# Patient Record
Sex: Female | Born: 1995 | Race: Black or African American | Hispanic: No | Marital: Single | Smoking: Never smoker
Health system: Southern US, Community
[De-identification: ages and names within clinical notes are randomized; demographics above are authoritative.]

## PROBLEM LIST (undated history)

## (undated) ENCOUNTER — Inpatient Hospital Stay (HOSPITAL_COMMUNITY): Payer: Self-pay

## (undated) DIAGNOSIS — Z789 Other specified health status: Secondary | ICD-10-CM

## (undated) HISTORY — PX: TONSILLECTOMY AND ADENOIDECTOMY: SHX28

---
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
## 2015-02-25 ENCOUNTER — Ambulatory Visit (INDEPENDENT_AMBULATORY_CARE_PROVIDER_SITE_OTHER): Payer: BC Managed Care – PPO | Admitting: Obstetrics & Gynecology

## 2015-02-25 ENCOUNTER — Encounter: Payer: Self-pay | Admitting: Obstetrics & Gynecology

## 2015-02-25 VITALS — BP 128/79 | HR 75 | Temp 98.4°F | Ht 62.0 in | Wt 234.8 lb

## 2015-02-25 DIAGNOSIS — Z30013 Encounter for initial prescription of injectable contraceptive: Secondary | ICD-10-CM

## 2015-02-25 DIAGNOSIS — Z3042 Encounter for surveillance of injectable contraceptive: Secondary | ICD-10-CM

## 2015-02-25 LAB — POCT PREGNANCY, URINE: PREG TEST UR: NEGATIVE

## 2015-02-25 MED ORDER — MEDROXYPROGESTERONE ACETATE 150 MG/ML IM SUSP
150.0000 mg | Freq: Once | INTRAMUSCULAR | Status: AC
  Administered 2015-02-25: 150 mg via INTRAMUSCULAR

## 2015-02-25 NOTE — Progress Notes (Signed)
Subjective:     Patient ID: Alisha BaltimoreMiracle Beasley, female   DOB: 01/19/1996, 19 y.o.   MRN: [id number]  HPIcc: wants to start depo provera [id number] Patient's last menstrual period was 02/20/2015 (exact date). Uses condoms, wishes to have depo provera. Currently menstruating Past Medical History  Diagnosis Date  . Medical history non-contributory    Past Surgical History  Procedure Laterality Date  . Tonsillectomy and adenoidectomy     No Known Allergies Current Outpatient Prescriptions on File Prior to Visit  Medication Sig Dispense Refill  . ibuprofen (ADVIL,MOTRIN) 600 MG tablet Take 1 tablet (600 mg total) by mouth every 6 (six) hours. 30 tablet 0  . ferrous sulfate 325 (65 FE) MG tablet Take 1 tablet (325 mg total) by mouth 3 (three) times daily with meals. (Patient not taking: Reported on 02/25/2015) 60 tablet 3   No current facility-administered medications on file prior to visit.     Review of Systems  Constitutional: Negative.   Gastrointestinal: Negative.   Genitourinary: Positive for vaginal bleeding. Negative for vaginal discharge, menstrual problem and pelvic pain.       Objective:   Physical Exam  Constitutional: She is oriented to person, place, and time. She appears well-developed. No distress.  Pulmonary/Chest: Effort normal. No respiratory distress.  Genitourinary:  deferred  Neurological: She is alert and oriented to person, place, and time.  Skin: Skin is warm and dry.  Psychiatric: She has a normal mood and affect. Her behavior is normal.       Assessment:     Candidate for DMPA as BCM, method and risks discussed     Plan:     DMPA 150 mg IM Q2342m, instructions given, questions answered  10 min face to face and coordination of care  Adam [place] G Miasha Emmons, MD 02/25/2015

## 2015-02-25 NOTE — Patient Instructions (Signed)

## 2015-03-26 ENCOUNTER — Emergency Department (HOSPITAL_COMMUNITY)
Admission: EM | Admit: 2015-03-26 | Discharge: 2015-03-26 | Disposition: A | Discharge status: Home / Self Care | Payer: BC Managed Care – PPO | Attending: Emergency Medicine | Admitting: Emergency Medicine

## 2015-03-26 ENCOUNTER — Encounter (HOSPITAL_COMMUNITY): Payer: Self-pay | Admitting: Emergency Medicine

## 2015-03-26 DIAGNOSIS — Z87828 Personal history of other (healed) physical injury and trauma: Secondary | ICD-10-CM | POA: Insufficient documentation

## 2015-03-26 DIAGNOSIS — M25561 Pain in right knee: Secondary | ICD-10-CM | POA: Insufficient documentation

## 2015-03-26 DIAGNOSIS — M25562 Pain in left knee: Secondary | ICD-10-CM | POA: Insufficient documentation

## 2015-03-26 MED ORDER — IBUPROFEN 600 MG PO TABS: 600.0000 mg | ORAL_TABLET | Freq: Four times a day (QID) | ORAL | Status: AC | PRN

## 2015-03-26 NOTE — ED Notes (Signed)
Pt reports recurrent r/knee pain when she exercises

## 2015-03-26 NOTE — Discharge Instructions (Signed)

## 2015-03-26 NOTE — ED Provider Notes (Signed)
CSN: [id number]     Arrival date & time 03/26/15  1836 History  This chart was scribed for Fayrene HelperBowie Jarriel Papillion, PA-C, working with Arby BarretteMarcy Pfeiffer, MD by Chestine SporeSoijett Blue, ED Scribe. The patient was seen in room WTR7/WTR7 at 7:06 PM.     Chief Complaint  Patient presents with  . Knee Pain    increased r/knee pain      The history is provided by the patient. No language interpreter was used.    HPI Comments: [name] is a 19 y.o. female who presents to the Emergency Department complaining of right knee pain onset 2 years. Pt notes that she has only had pain in her knee for the past couple years. pt notes that she had an injury to her right knee 2 years ago where she attempted to do a front flip and landed on the cement. Pt is in numerous activities at school that are exacerbating her pain. Pt notes that she has pain when she tries to stand. Pt reports that the pain occurs when she exercises only. She denies joint swelling, color change, wound, and any other symptoms.She is planning on trying out for dance and wanted to have her knees check.  No hip or ankle pain.     Past Medical History  Diagnosis Date  . Medical history non-contributory    Past Surgical History  Procedure Laterality Date  . Tonsillectomy and adenoidectomy     Family History  Problem Relation Age of Onset  . Hypertension Mother   . Diabetes Mother    History  Substance Use Topics  . Smoking status: Never Smoker   . Smokeless tobacco: Not on file  . Alcohol Use: No   OB History    Gravida Para Term Preterm AB TAB SAB Ectopic Multiple Living   2 1 1  0 1 1 0 0 0 1     Review of Systems  Musculoskeletal: Positive for arthralgias. Negative for joint swelling.  Skin: Negative for color change.      Allergies  Review of patient's allergies indicates no known allergies.  Home Medications   Prior to Admission medications   Medication Sig Start Date End Date Taking? Authorizing Provider  ferrous sulfate 325  (65 FE) MG tablet Take 1 tablet (325 mg total) by mouth 3 (three) times daily with meals. Patient not taking: Reported on 02/25/2015 02/17/14   Dorathy KinsmanVirginia Smith, CNM  ibuprofen (ADVIL,MOTRIN) 600 MG tablet Take 1 tablet (600 mg total) by mouth every 6 (six) hours. 05/04/14   Hillery Hunteraleb G Melancon, MD   BP 116/72 mmHg  Pulse 82  Temp(Src) 98.3 F (36.8 C) (Oral)  Resp 16  SpO2 99%  LMP 03/09/2015 Physical Exam  Constitutional: She is oriented to person, place, and time. She appears well-developed and well-nourished. No distress.  HENT:  Head: Normocephalic and atraumatic.  Eyes: EOM are normal.  Neck: Neck supple. No tracheal deviation present.  Cardiovascular: Normal rate.   Pulmonary/Chest: Effort normal. No respiratory distress.  Musculoskeletal: Normal range of motion.       Right hip: Normal.       Left hip: Normal.       Right knee: Tenderness found.       Left knee: Tenderness found.       Right ankle: Normal.       Left ankle: Normal.  Right and left knee tenderness noted at the anterior knee with tibial tuberosity. Negative anterior and posterior drawer test. No pain with varus and valgus  maneuver. Right and left ankle and right and left hip nl.   Able to ambulate without difficulty.  Neurological: She is alert and oriented to person, place, and time.  Skin: Skin is warm and dry.  Psychiatric: She has a normal mood and affect. Her behavior is normal.  Nursing note and vitals reviewed.   ED Course  Procedures (including critical care time) DIAGNOSTIC STUDIES: Oxygen Saturation is 99% on RA, nl by my interpretation.    COORDINATION OF CARE: 7:14 PM-chronic recurrent anterior bilateral knees pain. No significant finding on exam concerning for fractures or dislocation. No signs to suggest septic joint, or cellulitis. No significant knee effusion. Suspect arthritic changes versus internal derangement. Discussed treatment plan which includes referral to orthopedist with pt at bedside  and pt agreed to plan. Patient and I agreed that advanced imaging at this time is not indicated.  Labs Review Labs Reviewed - No data to display  Imaging Review No results found.   EKG Interpretation None      MDM   Final diagnoses:  Bilateral anterior knee pain    BP 116/72 mmHg  Pulse 82  Temp(Src) 98.3 F (36.8 C) (Oral)  Resp 16  SpO2 99%  LMP 03/09/2015   I personally performed the services described in this documentation, which was scribed in my presence. The recorded information has been reviewed and is accurate.     Fayrene Helper, PA-C 03/26/15 1920  Fayrene Helper, PA-C 03/26/15 1923  Arby Barrette, MD 03/30/15 [phone]

## 2015-05-13 ENCOUNTER — Ambulatory Visit: Payer: BC Managed Care – PPO

## 2015-09-28 ENCOUNTER — Ambulatory Visit: Payer: BC Managed Care – PPO | Admitting: Obstetrics and Gynecology
